# Patient Record
Sex: Female | Born: 1940 | Race: White | Hispanic: No | Smoking: Never smoker
Health system: Southern US, Community
[De-identification: ages and names within clinical notes are randomized; demographics above are authoritative.]

## PROBLEM LIST (undated history)

## (undated) DIAGNOSIS — R1310 Dysphagia, unspecified: Secondary | ICD-10-CM

## (undated) DIAGNOSIS — J329 Chronic sinusitis, unspecified: Secondary | ICD-10-CM

## (undated) DIAGNOSIS — K219 Gastro-esophageal reflux disease without esophagitis: Secondary | ICD-10-CM

## (undated) DIAGNOSIS — J45909 Unspecified asthma, uncomplicated: Secondary | ICD-10-CM

## (undated) DIAGNOSIS — M797 Fibromyalgia: Secondary | ICD-10-CM

## (undated) DIAGNOSIS — I4891 Unspecified atrial fibrillation: Secondary | ICD-10-CM

## (undated) HISTORY — PX: BACK SURGERY: SHX140

---

## 2009-07-16 ENCOUNTER — Ambulatory Visit: Payer: Self-pay | Admitting: Family Medicine

## 2009-07-16 DIAGNOSIS — S46909A Unspecified injury of unspecified muscle, fascia and tendon at shoulder and upper arm level, unspecified arm, initial encounter: Secondary | ICD-10-CM | POA: Insufficient documentation

## 2009-07-16 DIAGNOSIS — J45909 Unspecified asthma, uncomplicated: Secondary | ICD-10-CM | POA: Insufficient documentation

## 2009-07-16 DIAGNOSIS — S139XXA Sprain of joints and ligaments of unspecified parts of neck, initial encounter: Secondary | ICD-10-CM | POA: Insufficient documentation

## 2009-07-16 DIAGNOSIS — S4980XA Other specified injuries of shoulder and upper arm, unspecified arm, initial encounter: Secondary | ICD-10-CM | POA: Insufficient documentation

## 2009-07-16 DIAGNOSIS — S99919A Unspecified injury of unspecified ankle, initial encounter: Secondary | ICD-10-CM

## 2009-07-16 DIAGNOSIS — S8990XA Unspecified injury of unspecified lower leg, initial encounter: Secondary | ICD-10-CM | POA: Insufficient documentation

## 2009-07-16 DIAGNOSIS — M1990 Unspecified osteoarthritis, unspecified site: Secondary | ICD-10-CM | POA: Insufficient documentation

## 2009-07-16 DIAGNOSIS — S2341XA Sprain of ribs, initial encounter: Secondary | ICD-10-CM | POA: Insufficient documentation

## 2009-07-16 DIAGNOSIS — S99929A Unspecified injury of unspecified foot, initial encounter: Secondary | ICD-10-CM

## 2010-07-29 NOTE — Letter (Signed)
Summary: Out of Work  [facility]  [address]   [place], [place] [address]   Phone: [phone]  Fax: [phone]    July 16, 2009   Employee:  EDY MCBANE    To Whom It May Concern:   For Medical reasons, please excuse the above named employee from work today.  If you need additional information, please feel free to contact our office.         Sincerely,    Donna Christen MD

## 2010-07-29 NOTE — Assessment & Plan Note (Signed)
Summary: INJURY TO LEG & RIBS/KH   Vital Signs:  Patient Profile:   70 Years Old Female CC:      Fall-kneeand L rib pain Height:     62 inches Weight:      148 pounds O2 Sat:      96 % O2 treatment:    Room Air Temp:     96.3 degrees F oral Pulse rate:   75 / minute Resp:     12 per minute BP sitting:   159 / 81  (right arm)  Pt. in pain?   yes    Location:   knee    Intensity:   8    Type:       dull  Vitals Entered By: Lita Mains                   Updated Prior Medication List: PREDNISONE 10 MG TABS (PREDNISONE) once daily * TEMAZEPAM 10 MG CAPS (TEMAZEPAM) qhs GLUCOSAMINE FORTE  CAPS (NUTRITIONAL SUPPLEMENTS) BID  Current Allergies: ! ASPIR-LOW (ASPIRIN) ! CODEINE PHOSPHATE (CODEINE PHOSPHATE)History of Present Illness History from: patient Chief Complaint: Fall-kneeand L rib pain History of Present Illness: Fell on ice this AM (07/16/2009). Fell on left side. Patient c/o left knee pain, left rib pain (burning) with some SOB, and sore neck. Left knee is slightly edematous with two abrasions. Left side is unremarkable.  Subjective:  Patient complains of history as above.  No loss of consciousness.  No neuro symptoms.  No shortness of breath   REVIEW OF SYSTEMS Constitutional Symptoms      Denies fever, chills, night sweats, weight loss, weight gain, and fatigue.  Eyes       Denies change in vision, eye pain, eye discharge, glasses, contact lenses, and eye surgery. Ear/Nose/Throat/Mouth       Denies hearing loss/aids, change in hearing, ear pain, ear discharge, dizziness, frequent runny nose, frequent nose bleeds, sinus problems, sore throat, hoarseness, and tooth pain or bleeding.  Respiratory       Denies dry cough, productive cough, wheezing, shortness of breath, asthma, bronchitis, and emphysema/COPD.  Cardiovascular       Denies murmurs, chest pain, and tires easily with exhertion.    Gastrointestinal       Denies stomach pain, nausea/vomiting,  diarrhea, constipation, blood in bowel movements, and indigestion. Genitourniary       Denies painful urination, kidney stones, and loss of urinary control. Neurological       Denies paralysis, seizures, and fainting/blackouts. Musculoskeletal       Complains of muscle pain, joint pain, and swelling.      Denies joint stiffness, decreased range of motion, redness, muscle weakness, and gout.      Comments: left knee, left side, and sore neck Skin       Denies bruising, unusual mles/lumps or sores, and hair/skin or nail changes.      Comments: abrasions to left knee Psych       Denies mood changes, temper/anger issues, anxiety/stress, speech problems, depression, and sleep problems.  Past History:  Past Medical History: fibromyalgia Asthma Osteoarthritis  Past Surgical History: nasal polyps removed left wrist repair post fall-2006 cervial spinal surgery from same fall in 2006 Hysterectomy  Family History: Family History Breast cancer 1st degree relative <50-sister Family History of Cardiovascular disorder-stenosis-mother  Social History: Never Smoked Alcohol use-no Drug use-no Smoking Status:  never Drug Use:  no   Objective:  No acute distress.  Patient is alert and oriented  Eyes:  Pupils are equal, round, and reactive to light and accomdation.  Extraocular movement is intact.  Conjunctivae are not inflamed.  Mouth:  No injuries Neck:  Mild bilateral muscle tenderness Lungs:  Clear to auscultation.  Breath sounds are equal.  Chest:  Tenderness over left posterior/lateral ribs Heart:  Regular rate and rhythm without murmurs, rubs, or gallops.  Abdomen:  Nontender without masses or hepatosplenomegaly.  Bowel sounds are present.  No CVA or flank tenderness.  Left shoulder:  Mild diffuse tenderness without swelling or deformity.  Good internal/external rotation.  Has difficulty completely abducting above horizontal.  Left knee:  No effusion,  erythema, or warmth.  Knee  stable, negative drawer test.  McMurray test negative.  Full range of motion.   Tenderness over patella with minimal abrasion there. X-ray results: Chest with rib detail negative Left shoulder negative Left knee negative C-spine:  post-surgical changes, nothing acute Assessment New Problems: KNEE INJURY, LEFT (ICD-959.7) SPRAIN AND STRAIN OF RIBS (ICD-848.3) SHOULDER INJURY, LEFT (ICD-959.2) CERVICAL STRAIN, ACUTE (ICD-847.0) FAMILY HISTORY BREAST CANCER 1ST DEGREE RELATIVE <50 (ICD-V16.3) OSTEOARTHRITIS (ICD-715.90) ASTHMA (ICD-493.90)   Plan New Orders: T-Chest x-ray, 3 views [71022] T-DG Shoulder*L* [73030] T-DG Knee Complete 4 Views*L* [id number] T-Cervical Spine Comp 4 Views [72050TC] New Patient Level III [[address]] Rib Belt [L0210] Planning Comments:   Wear C-collar (which she already has) for 3 to 5 days.  Ice packs.  Tylenol for pain as needed Rib belt dispensed.  Bandage and Bacitracin applied to small abrasion left knee.  Ace wrap applied. Begin shoulder and knee exercises.  ([facility] information and instruction patient handout given)  Follow-up with PCP if not improved 10 to 14 days.   The patient and/or caregiver has been counseled thoroughly with regard to medications prescribed including dosage, schedule, interactions, rationale for use, and possible side effects and they verbalize understanding.  Diagnoses and expected course of recovery discussed and will return if not improved as expected or if the condition worsens. Patient and/or caregiver verbalized understanding.

## 2018-06-17 ENCOUNTER — Other Ambulatory Visit: Payer: Self-pay

## 2018-06-17 ENCOUNTER — Emergency Department
Admission: EM | Admit: 2018-06-17 | Discharge: 2018-06-17 | Disposition: A | Discharge status: Home / Self Care | Payer: Medicare Other | Source: Home / Self Care

## 2018-06-17 ENCOUNTER — Emergency Department (INDEPENDENT_AMBULATORY_CARE_PROVIDER_SITE_OTHER): Payer: Medicare Other

## 2018-06-17 ENCOUNTER — Encounter: Payer: Self-pay | Admitting: Emergency Medicine

## 2018-06-17 DIAGNOSIS — K5903 Drug induced constipation: Secondary | ICD-10-CM | POA: Diagnosis not present

## 2018-06-17 DIAGNOSIS — R05 Cough: Secondary | ICD-10-CM

## 2018-06-17 HISTORY — DX: Unspecified atrial fibrillation: I48.91

## 2018-06-17 HISTORY — DX: Dysphagia, unspecified: R13.10

## 2018-06-17 HISTORY — DX: Chronic sinusitis, unspecified: J32.9

## 2018-06-17 HISTORY — DX: Unspecified asthma, uncomplicated: J45.909

## 2018-06-17 HISTORY — DX: Gastro-esophageal reflux disease without esophagitis: K21.9

## 2018-06-17 HISTORY — DX: Fibromyalgia: M79.7

## 2018-06-17 LAB — POCT INFLUENZA A/B
Influenza A, POC: NEGATIVE
Influenza B, POC: NEGATIVE

## 2018-06-17 MED ORDER — LINACLOTIDE 145 MCG PO CAPS: 145.0000 ug | ORAL_CAPSULE | Freq: Every day | ORAL | 1 refills | Status: DC

## 2018-06-17 NOTE — ED Triage Notes (Signed)
Nausea, body aches,chills, pain all over, constipation x 4 days

## 2018-06-17 NOTE — Discharge Instructions (Addendum)
Increase water intake.  Try eating cheerios.  Increase fiber

## 2018-06-17 NOTE — ED Provider Notes (Signed)
[facility]    CSN: [id number] Arrival date & time: 06/17/18  1416     History   Chief Complaint Chief Complaint  Patient presents with  . Nausea    HPI Tanya Rodriguez is a 77 y.o. female.   The history is provided by the patient and a relative. No language interpreter was used.  Weakness  This is a recurrent problem. The problem has not changed since onset.There was no focality noted. Pertinent negatives include no shortness of breath. There were no medications administered prior to arrival. Associated medical issues do not include trauma.  Pt complains of feeling weak and constipated.  Family member reports Home health nurse wanted pt to come here for a flu test and a chest xray.  Pt reports she has been constipated.  No relief from exlax.  Pt is aon narcotics for pain. Pt reports poor appetite   Past Medical History:  Diagnosis Date  . A-fib (HCC)   . Asthma   . Chronic sinusitis   . Dysphagia   . Fibromyalgia   . GERD (gastroesophageal reflux disease)     Patient Active Problem List   Diagnosis Date Noted  . ASTHMA 07/16/2009  . OSTEOARTHRITIS 07/16/2009  . CERVICAL STRAIN, ACUTE 07/16/2009  . SPRAIN AND STRAIN OF RIBS 07/16/2009  . SHOULDER INJURY, LEFT 07/16/2009  . KNEE INJURY, LEFT 07/16/2009    History reviewed. No pertinent surgical history.  OB History   No obstetric history on file.      Home Medications    Prior to Admission medications   Medication Sig Start Date End Date Taking? Authorizing Provider  Glucosamine-Chondroit-Vit C-Mn (GLUCOSAMINE CHONDR 500 COMPLEX PO) Take by mouth.   Yes [provider]  Lidocaine-Menthol 4-1 % GEL Apply topically.   Yes [provider]  Omega-3 Fatty Acids (FISH OIL) 1000 MG CAPS Take by mouth.   Yes [provider]  pantoprazole (PROTONIX) 40 MG tablet Take 40 mg by mouth daily.   Yes [provider]  polyethylene glycol (MIRALAX / GLYCOLAX) packet Take 17 g by  mouth daily.   Yes [provider]  predniSONE (DELTASONE) 10 MG tablet Take 10 mg by mouth daily with breakfast.   Yes [provider]  senna-docusate (SENOKOT-S) 8.6-50 MG tablet Take 1 tablet by mouth daily.   Yes [provider]  vitamin E 400 UNIT capsule Take 400 Units by mouth daily.   Yes [provider]  linaclotide Karlene Einstein) 145 MCG CAPS capsule Take 1 capsule (145 mcg total) by mouth daily before breakfast. 06/17/18   Elson Areas, PA-C    Family History No family history on file.  Social History Social History   Tobacco Use  . Smoking status: Never Smoker  . Smokeless tobacco: Never Used  Substance Use Topics  . Alcohol use: Not Currently  . Drug use: Never     Allergies   Aspirin; Codeine phosphate; Lyrica [pregabalin]; Oxycodone; and Pollen extract   Review of Systems Review of Systems  Respiratory: Negative for shortness of breath.   Gastrointestinal: Positive for abdominal pain.  Neurological: Positive for weakness.  All other systems reviewed and are negative.    Physical Exam Triage Vital Signs ED Triage Vitals  Enc Vitals Group     BP 06/17/18 1533 (!) 161/93     Pulse Rate 06/17/18 1533 77     Resp --      Temp 06/17/18 1533 98.1 F (36.7 C)     Temp  Source 06/17/18 1533 Oral     SpO2 06/17/18 1533 99 %     Weight 06/17/18 1534 146 lb (66.2 kg)     Height 06/17/18 1534 5\' 2"  (1.575 m)     Head Circumference --      Peak Flow --      Pain Score 06/17/18 1533 5     Pain Loc --      Pain Edu? --      Excl. in GC? --    No data found.  Updated Vital Signs BP (!) 161/93 (BP Location: Left Arm)   Pulse 77   Temp 98.1 F (36.7 C) (Oral)   Ht 5\' 2"  (1.575 m)   Wt 146 lb (66.2 kg)   SpO2 99%   BMI 26.70 kg/m   Visual Acuity Right Eye Distance:   Left Eye Distance:   Bilateral Distance:    Right Eye Near:   Left Eye Near:    Bilateral Near:     Physical Exam Vitals signs reviewed.  HENT:      Head: Normocephalic.     Right Ear: Tympanic membrane normal.     Left Ear: Tympanic membrane normal.     Nose: Nose normal.  Eyes:     Pupils: Pupils are equal, round, and reactive to light.  Neck:     Musculoskeletal: Normal range of motion.  Cardiovascular:     Rate and Rhythm: Normal rate.     Pulses: Normal pulses.  Pulmonary:     Effort: Pulmonary effort is normal.  Abdominal:     General: Abdomen is flat.  Musculoskeletal: Normal range of motion.  Skin:    General: Skin is warm.  Neurological:     General: No focal deficit present.     Mental Status: She is alert.  Psychiatric:        Mood and Affect: Mood normal.      UC Treatments / Results  Labs (all labs ordered are listed, but only abnormal results are displayed) Labs Reviewed  POCT INFLUENZA A/B    EKG None  Radiology Dg Abd Acute W/chest  Result Date: 06/17/2018 CLINICAL DATA:  Pain and cough EXAM: DG ABDOMEN ACUTE W/ 1V CHEST COMPARISON:  07/16/2009 FINDINGS: Postsurgical changes in the cervical spine. No acute consolidation or effusion. Normal cardiomediastinal silhouette with aortic atherosclerosis. Supine and upright views of the abdomen demonstrate no free air beneath the diaphragm. Nonobstructed bowel-gas pattern with mild stool. Multiple phleboliths in the pelvis. Small lucent lesion/possible cyst in the right inferior pubic ramus. Treated compression fracture L1. IMPRESSION: 1. No radiographic evidence for acute cardiopulmonary abnormality 2. Nonobstructed bowel-gas pattern Electronically Signed   By: Jasmine PangKim  Fujinaga M.D.   On: 06/17/2018 16:40    Procedures Procedures (including critical care time)  Medications Ordered in UC Medications - No data to display  Initial Impression / Assessment and Plan / UC Course  I have reviewed the triage vital signs and the nursing notes.  Pertinent labs & imaging results that were available during my care of the patient were reviewed by me and considered in  my medical decision making (see chart for details).     Influenza negative,  Chest xray no acute.  Pt given rx for linzess.  Pt advised try fleets x1, ducolax supp x1 and one dose of MOM.  Pt encouraged to drink water, encourage to drink ensure for protein and fiber (cherrios) fruit and vegetables.  Final Clinical Impressions(s) / UC Diagnoses   Final  diagnoses:  Drug-induced constipation     Discharge Instructions     Increase water intake.  Try eating cheerios.  Increase fiber   ED Prescriptions    Medication Sig Dispense Auth. Provider   linaclotide (LINZESS) 145 MCG CAPS capsule Take 1 capsule (145 mcg total) by mouth daily before breakfast. 30 capsule Elson AreasSofia, Leslie K, [place]-C     Controlled Substance Prescriptions [place] Controlled Substance Registry consulted? Not Applicable   Elson AreasSofia, Leslie K, [place]-C 06/17/18 1729

## 2018-07-21 ENCOUNTER — Emergency Department (INDEPENDENT_AMBULATORY_CARE_PROVIDER_SITE_OTHER): Payer: Medicare Other

## 2018-07-21 ENCOUNTER — Encounter: Payer: Self-pay | Admitting: *Deleted

## 2018-07-21 ENCOUNTER — Emergency Department (INDEPENDENT_AMBULATORY_CARE_PROVIDER_SITE_OTHER)
Admission: EM | Admit: 2018-07-21 | Discharge: 2018-07-21 | Disposition: A | Discharge status: Home / Self Care | Payer: Medicare Other | Source: Home / Self Care | Attending: Family Medicine | Admitting: Family Medicine

## 2018-07-21 DIAGNOSIS — M8000XA Age-related osteoporosis with current pathological fracture, unspecified site, initial encounter for fracture: Secondary | ICD-10-CM

## 2018-07-21 DIAGNOSIS — I700 Atherosclerosis of aorta: Secondary | ICD-10-CM

## 2018-07-21 DIAGNOSIS — X58XXXA Exposure to other specified factors, initial encounter: Secondary | ICD-10-CM

## 2018-07-21 DIAGNOSIS — S2231XA Fracture of one rib, right side, initial encounter for closed fracture: Secondary | ICD-10-CM | POA: Diagnosis not present

## 2018-07-21 DIAGNOSIS — S22000A Wedge compression fracture of unspecified thoracic vertebra, initial encounter for closed fracture: Secondary | ICD-10-CM | POA: Diagnosis not present

## 2018-07-21 DIAGNOSIS — T1490XA Injury, unspecified, initial encounter: Secondary | ICD-10-CM | POA: Diagnosis not present

## 2018-07-21 DIAGNOSIS — J90 Pleural effusion, not elsewhere classified: Secondary | ICD-10-CM | POA: Diagnosis not present

## 2018-07-21 DIAGNOSIS — S22079A Unspecified fracture of T9-T10 vertebra, initial encounter for closed fracture: Secondary | ICD-10-CM

## 2018-07-21 MED ORDER — MORPHINE SULFATE 15 MG PO TABS: 15.0000 mg | ORAL_TABLET | Freq: Four times a day (QID) | ORAL | 0 refills | Status: DC | PRN

## 2018-07-21 MED ORDER — ONDANSETRON 4 MG PO TBDP
4.0000 mg | ORAL_TABLET | Freq: Once | ORAL | Status: AC
  Administered 2018-07-21: 4 mg via ORAL

## 2018-07-21 MED ORDER — CALCITONIN (SALMON) 200 UNIT/ACT NA SOLN: NASAL | 0 refills | Status: DC

## 2018-07-21 NOTE — ED Triage Notes (Signed)
While on a "twisting" type exercise machine at the gym, pt had sudden onset lower thoracic/ upper lumbar back pain 2 weeks ago. Pain has gradually increased and is much worse today. Has a recent hx of lumbar disc injury with surgery in November.

## 2018-07-21 NOTE — Discharge Instructions (Addendum)
Stop taking oxycodone while taking morphine.  May take Tylenol 500mg  (4 times daily) with each dose of morphine.  After finishing morphine, may resume oxycodone.

## 2018-07-21 NOTE — ED Provider Notes (Signed)
[facility]    CSN: [id number] Arrival date & time: 07/21/18  [id number]     History   Chief Complaint Chief Complaint  Patient presents with  . Back Pain    HPI Lucy ChrisBlenda Dsouza is a 78 y.o. female.   Patient underwent kyphoplasty May 17, 2018 for osteoporotic compression fracture of L1.  She did reasonably well post-op with a physical therapy program.  About 2 weeks ago while doing a back "twisting" exercise on an exercise machine at her gym, she suddenly developed pain in her right lateral chest and increased pain in her thoracic spine area.  She has found no comfortable position, and her pain is not responding to oxycodone 5mg  with Tylenol.  The history is provided by the patient and a relative.  Back Pain  Location:  Thoracic spine and lumbar spine (right lateral chest) Radiates to:  Does not radiate Pain severity:  Severe Pain is:  Same all the time Onset quality:  Sudden Duration:  2 weeks Timing:  Constant Progression:  Unchanged Chronicity:  Recurrent Context: twisting   Relieved by:  Nothing Worsened by:  Movement, deep breathing, standing and twisting Ineffective treatments:  Narcotics Associated symptoms: chest pain   Associated symptoms: no abdominal pain, no bladder incontinence, no bowel incontinence, no dysuria, no fever, no leg pain, no numbness, no paresthesias, no pelvic pain, no perianal numbness, no tingling and no weakness   Risk factors: hx of osteoporosis, lack of exercise and menopause     Past Medical History:  Diagnosis Date  . A-fib (HCC)   . Asthma   . Chronic sinusitis   . Dysphagia   . Fibromyalgia   . GERD (gastroesophageal reflux disease)     Patient Active Problem List   Diagnosis Date Noted  . ASTHMA 07/16/2009  . OSTEOARTHRITIS 07/16/2009  . CERVICAL STRAIN, ACUTE 07/16/2009  . SPRAIN AND STRAIN OF RIBS 07/16/2009  . SHOULDER INJURY, LEFT 07/16/2009  . KNEE INJURY, LEFT 07/16/2009    Past Surgical History:  Procedure  Laterality Date  . BACK SURGERY         Home Medications    Prior to Admission medications   Medication Sig Start Date End Date Taking? Authorizing Provider  Glucosamine-Chondroit-Vit C-Mn (GLUCOSAMINE CHONDR 500 COMPLEX PO) Take by mouth.   Yes [provider]  linaclotide Karlene Einstein(LINZESS) 145 MCG CAPS capsule Take 1 capsule (145 mcg total) by mouth daily before breakfast. 06/17/18  Yes Cheron SchaumannSofia, Leslie K, PA-C  Omega-3 Fatty Acids (FISH OIL) 1000 MG CAPS Take by mouth.   Yes [provider]  pantoprazole (PROTONIX) 40 MG tablet Take 40 mg by mouth daily.   Yes [provider]  polyethylene glycol (MIRALAX / GLYCOLAX) packet Take 17 g by mouth daily.   Yes [provider]  predniSONE (DELTASONE) 10 MG tablet Take 10 mg by mouth daily with breakfast.   Yes [provider]  vitamin E 400 UNIT capsule Take 400 Units by mouth daily.   Yes [provider]  calcitonin, salmon, (MIACALCIN) 200 UNIT/ACT nasal spray Place one spray in one nostril daily.  Alternate side each day.  Do not use for more than two weeks 07/21/18   Lattie HawBeese, Shikira Folino A, MD  Lidocaine-Menthol 4-1 % GEL Apply topically.    [provider]  morphine (MSIR) 15 MG tablet Take 1 tablet (15 mg total) by mouth every 6 (six) hours as needed for severe pain. 07/21/18   Lattie HawBeese, Princella Jaskiewicz A, MD  senna-docusate (SENOKOT-S) 8.6-50  MG tablet Take 1 tablet by mouth daily.    [provider]    Family History Stroke, arthritis, cancer  Social History Social History   Tobacco Use  . Smoking status: Never Smoker  . Smokeless tobacco: Never Used  Substance Use Topics  . Alcohol use: Not Currently  . Drug use: Never     Allergies   Aspirin; Codeine phosphate; Lyrica [pregabalin]; Oxycodone; and Pollen extract   Review of Systems Review of Systems  Constitutional: Negative for fever.  Cardiovascular: Positive for chest pain.  Gastrointestinal: Negative for abdominal pain  and bowel incontinence.  Genitourinary: Negative for bladder incontinence, dysuria and pelvic pain.  Musculoskeletal: Positive for back pain.  Neurological: Negative for tingling, weakness, numbness and paresthesias.  All other systems reviewed and are negative.    Physical Exam Triage Vital Signs ED Triage Vitals  Enc Vitals Group     BP 07/21/18 0954 (!) 145/76     Pulse Rate 07/21/18 0954 88     Resp 07/21/18 0954 16     Temp 07/21/18 0954 97.7 F (36.5 C)     Temp Source 07/21/18 0954 Oral     SpO2 07/21/18 0954 99 %     Weight 07/21/18 0955 150 lb (68 kg)     Height 07/21/18 0955 5\' 2"  (1.575 m)     Head Circumference --      Peak Flow --      Pain Score 07/21/18 0955 10     Pain Loc --      Pain Edu? --      Excl. in GC? --    No data found.  Updated Vital Signs BP (!) 145/76 (BP Location: Right Arm)   Pulse 88   Temp 97.7 F (36.5 C) (Oral)   Resp 16   Ht 5\' 2"  (1.575 m)   Wt 68 kg   SpO2 99%   BMI 27.44 kg/m   Visual Acuity Right Eye Distance:   Left Eye Distance:   Bilateral Distance:    Right Eye Near:   Left Eye Near:    Bilateral Near:     Physical Exam Constitutional:      General: She is in acute distress.     Appearance: Normal appearance. She is not ill-appearing, toxic-appearing or diaphoretic.  HENT:     Head: Normocephalic.     Right Ear: External ear normal.     Left Ear: External ear normal.     Nose: Nose normal.     Mouth/Throat:     Mouth: Mucous membranes are moist.  Eyes:     Pupils: Pupils are equal, round, and reactive to light.  Neck:     Musculoskeletal: Normal range of motion.  Cardiovascular:     Rate and Rhythm: Normal rate.     Heart sounds: Normal heart sounds.  Pulmonary:     Breath sounds: Normal breath sounds.       Comments: Diffuse tenderness to palpation right posterior/lateral chest as noted on diagram.  Abdominal:     Tenderness: There is no abdominal tenderness.  Musculoskeletal:       Back:      Right lower leg: No edema.     Left lower leg: No edema.     Comments: Back:  Decreased range of motion. Tenderness in the midline and right paraspinous muscles as noted on diagram.  Strength and sensation in the lower extremities is normal.    Skin:    General: Skin is  warm and dry.     Findings: No rash.  Neurological:     General: No focal deficit present.     Mental Status: She is alert.      UC Treatments / Results  Labs (all labs ordered are listed, but only abnormal results are displayed) Labs Reviewed - No data to display  EKG None  Radiology Dg Ribs Unilateral W/chest Right  Result Date: 07/21/2018 CLINICAL DATA:  Chest pain.  Prior injury. EXAM: RIGHT RIBS AND CHEST - 3+ VIEW COMPARISON:  07/21/2018. FINDINGS: Mediastinum and hilar structures normal. Mild left base subsegmental atelectasis. Small bilateral pleural effusions. No pneumothorax. Slightly displaced right posterolateral seventh rib fracture is noted. Midthoracic vertebral body compression fracture appears stable. Degenerative change thoracic spine. Prior cervical spine fusion appears stable. Prior upper lumbar spine vertebroplasty appears stable. IMPRESSION: 1. Slightly displaced right posterolateral sixth rib fracture. No pneumothorax. 2.  Small bilateral pleural effusions. Electronically Signed   By: Maisie Fus  Register   On: 07/21/2018 11:32   Dg Thoracic Spine 2 View  Result Date: 07/21/2018 CLINICAL DATA:  Right-sided thoracic and chest pain for 2 weeks after exercising. EXAM: THORACIC SPINE 2 VIEWS COMPARISON:  Chest/abdominal radiographs 06/17/2018 FINDINGS: There is mild exaggeration of the thoracic kyphosis without listhesis. Mild S-shaped thoracolumbar scoliosis is again seen. There is a T9 compression fracture with mild-to-moderate vertebral body height loss with an appearance suggesting a recent fracture. Comparison with the prior chest/abdominal study is limited due to the lack of a lateral radiograph. Prior  anterior fusion is noted in the cervical spine. There has been prior L1 vertebral augmentation. Detailed rib assessment is reported separately. Aortic atherosclerosis is noted. IMPRESSION: Mild-to-moderate T9 compression fracture, potentially acute. Electronically Signed   By: Sebastian Ache M.D.   On: 07/21/2018 11:34   Dg Lumbar Spine 2-3 Views  Result Date: 07/21/2018 CLINICAL DATA:  Low back pain for 2 weeks since an injury while exercising. Initial encounter. EXAM: LUMBAR SPINE - 2-3 VIEW COMPARISON:  None. FINDINGS: The patient has remote L1 compression fracture where she is status post vertebral augmentation. No other fracture is identified. There is exaggeration of the normal lumbar lordosis and mild convex left scoliosis with the apex at L3-4. Intervertebral disc space height is maintained. Lower lumbar facet arthropathy is noted. There is some degenerative disease about the hips. Aortic atherosclerosis is identified. IMPRESSION: No acute abnormality. Status post vertebral augmentation for an L1 compression fracture. Lower lumbar facet arthropathy. Atherosclerosis. Electronically Signed   By: Drusilla Kanner M.D.   On: 07/21/2018 11:30    Procedures Procedures (including critical care time)  Medications Ordered in UC Medications  ondansetron (ZOFRAN-ODT) disintegrating tablet 4 mg (4 mg Oral Given 07/21/18 1102)    Initial Impression / Assessment and Plan / UC Course  I have reviewed the triage vital signs and the nursing notes.  Pertinent labs & imaging results that were available during my care of the patient were reviewed by me and considered in my medical decision making (see chart for details).    Note diffuse bone demineralization on x-rays.  Patient's present pain not controlled by oxycodone and Tylenol.  Rx written for morphine sulfate 15mg  (#20, no refill).  Patient notes that she has had only mild itching in the past with morphine and would like to try it again. Controlled  Substance Prescriptions I have consulted the [place] Controlled Substances Registry for this patient, and feel the risk/benefit ratio today is favorable for proceeding with this prescription for a  controlled substance.   For improved pain control, begin calcitonin nasal spray for about 2 weeks.  Will refer to [facility] [facility] and [facility] [place] [facility] with [facility] as scheduled as soon as possible.   Final Clinical Impressions(s) / UC Diagnoses   Final diagnoses:  Closed fracture of one rib of right side, initial encounter  Closed compression fracture of thoracic vertebra, initial encounter (HCC)  Age-related osteoporosis with current pathological fracture, initial encounter     Discharge Instructions     Stop taking oxycodone while taking morphine.  May take Tylenol 500mg  (4 times daily) with each dose of morphine.  After finishing morphine, may resume oxycodone.    ED Prescriptions    Medication Sig Dispense Auth. Provider   calcitonin, salmon, (MIACALCIN) 200 UNIT/ACT nasal spray Place one spray in one nostril daily.  Alternate side each day.  Do not use for more than two weeks 2.7 mL Lattie HawBeese, Daley Mooradian A, MD   morphine (MSIR) 15 MG tablet Take 1 tablet (15 mg total) by mouth every 6 (six) hours as needed for severe pain. 20 tablet Lattie HawBeese, Ryman Rathgeber A, MD         Lattie HawBeese, Jennye Runquist A, MD 07/21/18 [phone]

## 2018-07-22 ENCOUNTER — Telehealth: Payer: Self-pay

## 2018-07-22 NOTE — Telephone Encounter (Signed)
No opening for Dr T nor Dr Denyse Amass until Tuesday. Pt advised, declines to see any other provider for back pain until she can be seen here. Advised pt to keep her Tuesday appt and recommended she call on Monday to see if there were any cancellations. Pt agreeable

## 2018-07-22 NOTE — Telephone Encounter (Signed)
Do Dr. Denyse Amass or I have anything sooner?  If not I would recommend she contact her PCP for acute pain management until we can see her in a couple days.

## 2018-07-22 NOTE — Telephone Encounter (Signed)
Tanya Rodriguez states she would like to be seen sooner than Tuesday. She has terrible back pain.

## 2018-07-26 ENCOUNTER — Ambulatory Visit (INDEPENDENT_AMBULATORY_CARE_PROVIDER_SITE_OTHER): Payer: Medicare Other

## 2018-07-26 ENCOUNTER — Ambulatory Visit (INDEPENDENT_AMBULATORY_CARE_PROVIDER_SITE_OTHER): Payer: Medicare Other | Admitting: Sports Medicine

## 2018-07-26 ENCOUNTER — Encounter: Payer: Self-pay | Admitting: Sports Medicine

## 2018-07-26 DIAGNOSIS — M797 Fibromyalgia: Secondary | ICD-10-CM | POA: Diagnosis not present

## 2018-07-26 DIAGNOSIS — S2231XD Fracture of one rib, right side, subsequent encounter for fracture with routine healing: Secondary | ICD-10-CM | POA: Diagnosis not present

## 2018-07-26 DIAGNOSIS — X58XXXD Exposure to other specified factors, subsequent encounter: Secondary | ICD-10-CM

## 2018-07-26 DIAGNOSIS — M8080XA Other osteoporosis with current pathological fracture, unspecified site, initial encounter for fracture: Secondary | ICD-10-CM | POA: Diagnosis not present

## 2018-07-26 DIAGNOSIS — S2231XA Fracture of one rib, right side, initial encounter for closed fracture: Secondary | ICD-10-CM | POA: Diagnosis not present

## 2018-07-26 DIAGNOSIS — M4850XA Collapsed vertebra, not elsewhere classified, site unspecified, initial encounter for fracture: Secondary | ICD-10-CM | POA: Insufficient documentation

## 2018-07-26 DIAGNOSIS — S22070A Wedge compression fracture of T9-T10 vertebra, initial encounter for closed fracture: Secondary | ICD-10-CM

## 2018-07-26 DIAGNOSIS — M545 Low back pain: Secondary | ICD-10-CM | POA: Diagnosis not present

## 2018-07-26 DIAGNOSIS — M810 Age-related osteoporosis without current pathological fracture: Secondary | ICD-10-CM | POA: Insufficient documentation

## 2018-07-26 MED ORDER — DULOXETINE HCL 30 MG PO CPEP: 30.0000 mg | ORAL_CAPSULE | Freq: Every day | ORAL | 3 refills | Status: DC

## 2018-07-26 MED ORDER — ONDANSETRON 8 MG PO TBDP: 8.0000 mg | ORAL_TABLET | Freq: Three times a day (TID) | ORAL | 3 refills | Status: DC | PRN

## 2018-07-26 MED ORDER — POLYETHYLENE GLYCOL 3350 17 G PO PACK: 17.0000 g | PACK | Freq: Two times a day (BID) | ORAL | 11 refills | Status: DC

## 2018-07-26 MED ORDER — HYDROMORPHONE HCL 4 MG PO TABS: 4.0000 mg | ORAL_TABLET | Freq: Four times a day (QID) | ORAL | 0 refills | Status: DC | PRN

## 2018-07-26 MED ORDER — SENNOSIDES-DOCUSATE SODIUM 8.6-50 MG PO TABS: 2.0000 | ORAL_TABLET | Freq: Two times a day (BID) | ORAL | 0 refills | Status: DC

## 2018-07-26 NOTE — Assessment & Plan Note (Signed)
Age-related. Now has a vertebral compression fracture. I have advised her to discuss treatment of her osteoporosis with her primary care provider. There are multiple options, I think Fosamax would be a good place to start. She should also be on a calcium and vitamin D supplement twice a day. She is having some nausea, I think likely secondary to her narcotics so I will refill her Zofran.

## 2018-07-26 NOTE — Assessment & Plan Note (Signed)
We will never be able to fully control her pain until we get her fibromyalgia under control. Discontinue Zoloft, adding Cymbalta. Declines gabapentin and Lyrica.

## 2018-07-26 NOTE — Assessment & Plan Note (Signed)
Repeat x-rays. Thorax was strapped with a compressive dressing.

## 2018-07-26 NOTE — Progress Notes (Addendum)
Subjective:    CC: Multiple fractures  HPI:  This is a pleasant 78 year old female, she has a history of osteoporosis.  More recently she had a fall, ultimately she was seen in urgent care where x-rays showed 1/6 rib fracture on the right, as well as a T9 vertebral compression fracture with minimal height loss.  Pain is been severe, not controlled with MSIR 15 mg 4 times daily.  Severe, persistent, localized at the 2 above-named sites without radiation.  No progressive weakness in the legs, no constitutional symptoms.  I reviewed the past medical history, family history, social history, surgical history, and allergies today and no changes were needed.  Please see the problem list section below in epic for further details.  Past Medical History: Past Medical History:  Diagnosis Date  . A-fib (HCC)   . Asthma   . Chronic sinusitis   . Dysphagia   . Fibromyalgia   . GERD (gastroesophageal reflux disease)    Past Surgical History: Past Surgical History:  Procedure Laterality Date  . BACK SURGERY     Social History: Social History   Socioeconomic History  . Marital status: Divorced    Spouse name: Not on file  . Number of children: Not on file  . Years of education: Not on file  . Highest education level: Not on file  Occupational History  . Not on file  Social Needs  . Financial resource strain: Not on file  . Food insecurity:    Worry: Not on file    Inability: Not on file  . Transportation needs:    Medical: Not on file    Non-medical: Not on file  Tobacco Use  . Smoking status: Never Smoker  . Smokeless tobacco: Never Used  Substance and Sexual Activity  . Alcohol use: Not Currently  . Drug use: Never  . Sexual activity: Not on file  Lifestyle  . Physical activity:    Days per week: Not on file    Minutes per session: Not on file  . Stress: Not on file  Relationships  . Social connections:    Talks on phone: Not on file    Gets together: Not on file   Attends religious service: Not on file    Active member of club or organization: Not on file    Attends meetings of clubs or organizations: Not on file    Relationship status: Not on file  Other Topics Concern  . Not on file  Social History Narrative  . Not on file   Family History: No family history on file. Allergies: Allergies  Allergen Reactions  . Aspirin     REACTION: swelling  . Codeine Phosphate     REACTION: nausea, itching  . Lyrica [Pregabalin]   . Oxycodone   . Pollen Extract    Medications: See med rec.  Review of Systems: No headache, visual changes, nausea, vomiting, diarrhea, constipation, dizziness, abdominal pain, skin rash, fevers, chills, night sweats, swollen lymph nodes, weight loss, chest pain, body aches, joint swelling, muscle aches, shortness of breath, mood changes, visual or auditory hallucinations.  Objective:    General: Well Developed, well nourished, and in no acute distress.  Neuro: Alert and oriented x3, extra-ocular muscles intact, sensation grossly intact.  HEENT: Normocephalic, atraumatic, pupils equal round reactive to light, neck supple, no masses, no lymphadenopathy, thyroid nonpalpable.  Skin: Warm and dry, no rashes noted.  Cardiac: Regular rate and rhythm, no murmurs rubs or gallops.  Respiratory: Clear to auscultation  bilaterally. Not using accessory muscles, speaking in full sentences.  Abdominal: Soft, nontender, nondistended, positive bowel sounds, no masses, no organomegaly.  Mid back: Tender to palpation over the midthoracic spinous processes. Chest wall: Tender to palpation over the right rib cage at the mid axillary line.  Chest wall was strapped with a compressive dressing.  Impression and Recommendations:    The patient was counselled, risk factors were discussed, anticipatory guidance given.  Fibromyalgia We will never be able to fully control her pain until we get her fibromyalgia under control. Discontinue Zoloft,  adding Cymbalta. Declines gabapentin and Lyrica.  Right sixth rib fracture Repeat x-rays. Thorax was strapped with a compressive dressing.   Vertebral compression fracture (HCC) History of L1 compression fracture post vertebroplasty. More recently she fell and sustained a T9 compression fracture with mild height loss. Currently on Miacalcin. MS Contin 15 mg every 6 hours is not sufficiently controlling her pain so we will double dose and convert to Dilaudid, she is using 60 mg of morphine total daily, this converts to 9 to 12 mg of Dilaudid daily. Switching to Dilaudid 4 mg tabs for use 3 times daily to 4 times daily. She will discontinue all other narcotics. Adding MiraLAX, Senokot-S to prevent constipation. I would also like to contact our [employer] rep to get an Exos form 631 lumbosacral orthosis S/M, this back brace is to facilitate healing following injury to the spine.  Osteoporosis Age-related. Now has a vertebral compression fracture. I have advised her to discuss treatment of her osteoporosis with her primary care provider. There are multiple options, I think Fosamax would be a good place to start. She should also be on a calcium and vitamin D supplement twice a day. She is having some nausea, I think likely secondary to her narcotics so I will refill her Zofran.  I spent 60 minutes with this patient, greater than 50% was face-to-face time counseling regarding the above diagnoses, specifically anticipatory guidance regarding rib and thoracic vertebral compression fractures. ___________________________________________ Ihor Austin. Benjamin Stain, M.D., ABFM., CAQSM. Primary Care and Sports Medicine [facility]  Adjunct Professor of Family Medicine  [employer]

## 2018-07-26 NOTE — Assessment & Plan Note (Addendum)
History of L1 compression fracture post vertebroplasty. More recently she fell and sustained a T9 compression fracture with mild height loss. Currently on Miacalcin. MS Contin 15 mg every 6 hours is not sufficiently controlling her pain so we will double dose and convert to Dilaudid, she is using 60 mg of morphine total daily, this converts to 9 to 12 mg of Dilaudid daily. Switching to Dilaudid 4 mg tabs for use 3 times daily to 4 times daily. She will discontinue all other narcotics. Adding MiraLAX, Senokot-S to prevent constipation. I would also like to contact our [employer] rep to get an Exos form 631 lumbosacral orthosis S/M, this back brace is to facilitate healing following injury to the spine.

## 2018-07-28 ENCOUNTER — Telehealth: Payer: Self-pay

## 2018-07-28 NOTE — Telephone Encounter (Signed)
Mylani called to see if the back brace has arrived.

## 2018-07-28 NOTE — Telephone Encounter (Signed)
Yes, she can sleep with this on.

## 2018-07-28 NOTE — Telephone Encounter (Signed)
I don't see it.  We will call her when it does.  Pain medications working?

## 2018-07-28 NOTE — Telephone Encounter (Signed)
Pt advised. States that she doubled her dose of pain meds by recommendation of her PCP. She is now taking 2 tablets every 6 hours, seems to be working better.   Pt wants to know if she is supposed to sleep in the current band that she has?

## 2018-07-29 MED ORDER — PROMETHAZINE HCL 25 MG PO TABS: 25.0000 mg | ORAL_TABLET | Freq: Four times a day (QID) | ORAL | 3 refills | Status: DC | PRN

## 2018-07-29 NOTE — Telephone Encounter (Signed)
Could be, I am going to add Phenergan.  Make sure she is not taking the pain medicine on an empty stomach.  She also said she was doing better yesterday.

## 2018-07-29 NOTE — Telephone Encounter (Signed)
Pt advised.   Pt reports nausea and vomiting all night, taking Zofran but not helping. Unsure if increase in pain med is causing this. She is still in pain.   Please advise.... thanks

## 2018-08-01 NOTE — Telephone Encounter (Signed)
Pt advised. No further needs at this time 

## 2018-08-08 ENCOUNTER — Ambulatory Visit (INDEPENDENT_AMBULATORY_CARE_PROVIDER_SITE_OTHER): Payer: Medicare Other | Admitting: Sports Medicine

## 2018-08-08 ENCOUNTER — Encounter: Payer: Self-pay | Admitting: Sports Medicine

## 2018-08-08 DIAGNOSIS — M8080XD Other osteoporosis with current pathological fracture, unspecified site, subsequent encounter for fracture with routine healing: Secondary | ICD-10-CM

## 2018-08-08 DIAGNOSIS — S22070A Wedge compression fracture of T9-T10 vertebra, initial encounter for closed fracture: Secondary | ICD-10-CM

## 2018-08-08 DIAGNOSIS — M797 Fibromyalgia: Secondary | ICD-10-CM

## 2018-08-08 DIAGNOSIS — S2231XA Fracture of one rib, right side, initial encounter for closed fracture: Secondary | ICD-10-CM | POA: Diagnosis not present

## 2018-08-08 MED ORDER — HYDROMORPHONE HCL 4 MG PO TABS: 4.0000 mg | ORAL_TABLET | Freq: Two times a day (BID) | ORAL | 0 refills | Status: DC | PRN

## 2018-08-08 NOTE — Assessment & Plan Note (Addendum)
Mildly displaced right seventh rib fracture. Continue thoracic strapping. I am providing medical pain management.

## 2018-08-08 NOTE — Assessment & Plan Note (Signed)
Age-related osteoporosis with new vertebral compression fracture. I did ask her to discuss this with her PCP. We discussed Fosamax as an initial option with calcium and vitamin D supplementation twice a day.

## 2018-08-08 NOTE — Assessment & Plan Note (Signed)
We will never be able to fully control her pain until her fibromyalgia is under control. We did a discontinuation of Zoloft and added Cymbalta at the last visit. Unfortunately she has not taken this, I did inform her that at 1 month we would be discontinuing her narcotics and so she probably needs to get the Cymbalta on board. Declined gabapentin and Lyrica.

## 2018-08-08 NOTE — Progress Notes (Signed)
Subjective:    CC: Recheck multiple fractures  HPI: This is a pleasant 78 year old female, she recently had a fall and sustained a T9 vertebral compression fracture with only minimal height loss and no bony retropulsion into the spinal canal.  She also sustained a right seventh rib fracture, minimally displaced.  At the last visit we put her on Dilaudid, added a thoracic strapping brace.  She actually improved to some degree.  We sent her to her PCP for osteoporosis treatment as well.  Unfortunately she did run out of her Dilaudid.  Symptoms have worsened, localized in the mid back as well as the right sided chest wall without radiation.  No shortness of breath, no hemoptysis.  I did switch her from Zoloft to Cymbalta at the last visit, she has not even started it.  I reviewed the past medical history, family history, social history, surgical history, and allergies today and no changes were needed.  Please see the problem list section below in epic for further details.  Past Medical History: Past Medical History:  Diagnosis Date  . A-fib (HCC)   . Asthma   . Chronic sinusitis   . Dysphagia   . Fibromyalgia   . GERD (gastroesophageal reflux disease)    Past Surgical History: Past Surgical History:  Procedure Laterality Date  . BACK SURGERY     Social History: Social History   Socioeconomic History  . Marital status: Divorced    Spouse name: Not on file  . Number of children: Not on file  . Years of education: Not on file  . Highest education level: Not on file  Occupational History  . Not on file  Social Needs  . Financial resource strain: Not on file  . Food insecurity:    Worry: Not on file    Inability: Not on file  . Transportation needs:    Medical: Not on file    Non-medical: Not on file  Tobacco Use  . Smoking status: Never Smoker  . Smokeless tobacco: Never Used  Substance and Sexual Activity  . Alcohol use: Not Currently  . Drug use: Never  . Sexual  activity: Not on file  Lifestyle  . Physical activity:    Days per week: Not on file    Minutes per session: Not on file  . Stress: Not on file  Relationships  . Social connections:    Talks on phone: Not on file    Gets together: Not on file    Attends religious service: Not on file    Active member of club or organization: Not on file    Attends meetings of clubs or organizations: Not on file    Relationship status: Not on file  Other Topics Concern  . Not on file  Social History Narrative  . Not on file   Family History: No family history on file. Allergies: Allergies  Allergen Reactions  . Aspirin     REACTION: swelling  . Codeine Phosphate     REACTION: nausea, itching  . Lyrica [Pregabalin]   . Oxycodone   . Pollen Extract    Medications: See med rec.  Review of Systems: No fevers, chills, night sweats, weight loss, chest pain, or shortness of breath.   Objective:    General: Well Developed, well nourished, and in no acute distress.  Neuro: Alert and oriented x3, extra-ocular muscles intact, sensation grossly intact.  HEENT: Normocephalic, atraumatic, pupils equal round reactive to light, neck supple, no masses, no lymphadenopathy, thyroid  nonpalpable.  Skin: Warm and dry, no rashes. Cardiac: Regular rate and rhythm, no murmurs rubs or gallops, no lower extremity edema.  Respiratory: Clear to auscultation bilaterally. Not using accessory muscles, speaking in full sentences. Orthopedic: Tender to palpation in the midline of the thoracic spine, minimal.  More severe tenderness over the right rib cage, posterior axillary line around the seventh rib.  No palpable crepitus, no palpable step-offs.  Impression and Recommendations:    Right seventh rib fracture Mildly displaced right seventh rib fracture. Continue thoracic strapping. I am providing medical pain management.  Vertebral compression fracture (HCC) History of an L1 vertebral compression fracture post  vertebroplasty. She now has a new T9 compression fracture with only mild height loss, I do not think she will need a vertebroplasty. Discontinue Miacalcin. Morphine 15 mg every 6 hours not sufficiently controlling her pain, we converted to Dilaudid. This was Dilaudid 4 mg tabs 3-4 times a day. This is worked well and she is down to a twice a day use. Calling in #60 pills, and we will discontinue after 1 month. We are still awaiting Exos form 631 lumbosacral orthosis S/M for her back fracture. This was to facilitate healing following injury to the spine.  Fibromyalgia We will never be able to fully control her pain until her fibromyalgia is under control. We did a discontinuation of Zoloft and added Cymbalta at the last visit. Unfortunately she has not taken this, I did inform her that at 1 month we would be discontinuing her narcotics and so she probably needs to get the Cymbalta on board. Declined gabapentin and Lyrica.  Osteoporosis Age-related osteoporosis with new vertebral compression fracture. I did ask her to discuss this with her PCP. We discussed Fosamax as an initial option with calcium and vitamin D supplementation twice a day.  ___________________________________________ Ihor Austin. Benjamin Stain, M.D., ABFM., CAQSM. Primary Care and Sports Medicine  [facility]  Adjunct Professor of Family Medicine  [employer]

## 2018-08-08 NOTE — Assessment & Plan Note (Signed)
History of an L1 vertebral compression fracture post vertebroplasty. She now has a new T9 compression fracture with only mild height loss, I do not think she will need a vertebroplasty. Discontinue Miacalcin. Morphine 15 mg every 6 hours not sufficiently controlling her pain, we converted to Dilaudid. This was Dilaudid 4 mg tabs 3-4 times a day. This is worked well and she is down to a twice a day use. Calling in #60 pills, and we will discontinue after 1 month. We are still awaiting Exos form 631 lumbosacral orthosis S/M for her back fracture. This was to facilitate healing following injury to the spine.

## 2018-09-05 ENCOUNTER — Ambulatory Visit (INDEPENDENT_AMBULATORY_CARE_PROVIDER_SITE_OTHER): Payer: Medicare Other | Admitting: Sports Medicine

## 2018-09-05 ENCOUNTER — Encounter: Payer: Self-pay | Admitting: Sports Medicine

## 2018-09-05 DIAGNOSIS — M797 Fibromyalgia: Secondary | ICD-10-CM

## 2018-09-05 DIAGNOSIS — S22070D Wedge compression fracture of T9-T10 vertebra, subsequent encounter for fracture with routine healing: Secondary | ICD-10-CM

## 2018-09-05 DIAGNOSIS — S22070A Wedge compression fracture of T9-T10 vertebra, initial encounter for closed fracture: Secondary | ICD-10-CM

## 2018-09-05 MED ORDER — HYDROMORPHONE HCL 4 MG PO TABS: 4.0000 mg | ORAL_TABLET | Freq: Two times a day (BID) | ORAL | 0 refills | Status: DC | PRN

## 2018-09-05 MED ORDER — DULOXETINE HCL 60 MG PO CPEP: 60.0000 mg | ORAL_CAPSULE | Freq: Every day | ORAL | 3 refills | Status: AC

## 2018-09-05 NOTE — Assessment & Plan Note (Signed)
We are gaining better control of her pain I think partially due to the switch from Zoloft to Cymbalta. Increasing to 60 mg. She declined gabapentin and Lyrica. Return in a month.

## 2018-09-05 NOTE — Progress Notes (Signed)
Subjective:    CC: Follow-up  HPI: Yaricza returns, she is a pleasant 78 year old female, she has a chronic L1 compression fracture, subacute T9 vertebral compression fracture with minimal height loss as well as a right seventh rib fracture.  Overall doing much better on Dilaudid 4 mg twice daily.  I added Cymbalta at the last visit, and her depressive symptoms have improved considerably, she is more active, she is cooking she is moving around.  Happy with how things are going.  I reviewed the past medical history, family history, social history, surgical history, and allergies today and no changes were needed.  Please see the problem list section below in epic for further details.  Past Medical History: Past Medical History:  Diagnosis Date  . A-fib (HCC)   . Asthma   . Chronic sinusitis   . Dysphagia   . Fibromyalgia   . GERD (gastroesophageal reflux disease)    Past Surgical History: Past Surgical History:  Procedure Laterality Date  . BACK SURGERY     Social History: Social History   Socioeconomic History  . Marital status: Divorced    Spouse name: Not on file  . Number of children: Not on file  . Years of education: Not on file  . Highest education level: Not on file  Occupational History  . Not on file  Social Needs  . Financial resource strain: Not on file  . Food insecurity:    Worry: Not on file    Inability: Not on file  . Transportation needs:    Medical: Not on file    Non-medical: Not on file  Tobacco Use  . Smoking status: Never Smoker  . Smokeless tobacco: Never Used  Substance and Sexual Activity  . Alcohol use: Not Currently  . Drug use: Never  . Sexual activity: Not on file  Lifestyle  . Physical activity:    Days per week: Not on file    Minutes per session: Not on file  . Stress: Not on file  Relationships  . Social connections:    Talks on phone: Not on file    Gets together: Not on file    Attends religious service: Not on file   Active member of club or organization: Not on file    Attends meetings of clubs or organizations: Not on file    Relationship status: Not on file  Other Topics Concern  . Not on file  Social History Narrative  . Not on file   Family History: No family history on file. Allergies: Allergies  Allergen Reactions  . Aspirin     REACTION: swelling  . Codeine Phosphate     REACTION: nausea, itching  . Lyrica [Pregabalin]   . Oxycodone   . Pollen Extract    Medications: See med rec.  Review of Systems: No fevers, chills, night sweats, weight loss, chest pain, or shortness of breath.   Objective:    General: Well Developed, well nourished, and in no acute distress.  Neuro: Alert and oriented x3, extra-ocular muscles intact, sensation grossly intact.  HEENT: Normocephalic, atraumatic, pupils equal round reactive to light, neck supple, no masses, no lymphadenopathy, thyroid nonpalpable.  Skin: Warm and dry, no rashes. Cardiac: Regular rate and rhythm, no murmurs rubs or gallops, no lower extremity edema.  Respiratory: Clear to auscultation bilaterally. Not using accessory muscles, speaking in full sentences.  Impression and Recommendations:    Vertebral compression fracture (HCC) Old L1 compression fracture. Acute T9 compression fracture with mild height  loss. All of these are improving considerably. We did place her on Dilaudid 4 mg tabs, she is doing this twice a day. We will do this for an additional month before down taper. I also added Cymbalta, she does report better appetite, more active, happier. Increasing to 60 mg. Refilling Dilaudid. She ambulates with a rolling walker, and constipation is well controlled with Senokot-S and MiraLAX. She was unable to tolerate the TLSO brace. Return to see me in a month.   Fibromyalgia We are gaining better control of her pain I think partially due to the switch from Zoloft to Cymbalta. Increasing to 60 mg. She declined gabapentin  and Lyrica. Return in a month.   ___________________________________________ Ihor Austin. Benjamin Stain, M.D., ABFM., CAQSM. Primary Care and Sports Medicine  [facility]  Adjunct Professor of Family Medicine  [employer] [facility]

## 2018-09-05 NOTE — Assessment & Plan Note (Addendum)
Old L1 compression fracture. Acute T9 compression fracture with mild height loss. All of these are improving considerably. We did place her on Dilaudid 4 mg tabs, she is doing this twice a day. We will do this for an additional month before down taper. I also added Cymbalta, she does report better appetite, more active, happier. Increasing to 60 mg. Refilling Dilaudid. She ambulates with a rolling walker, and constipation is well controlled with Senokot-S and MiraLAX. She was unable to tolerate the TLSO brace. Return to see me in a month.

## 2018-10-03 ENCOUNTER — Ambulatory Visit: Payer: Medicare Other | Admitting: Sports Medicine

## 2018-10-04 ENCOUNTER — Ambulatory Visit (INDEPENDENT_AMBULATORY_CARE_PROVIDER_SITE_OTHER): Payer: Medicare Other | Admitting: Sports Medicine

## 2018-10-04 DIAGNOSIS — M797 Fibromyalgia: Secondary | ICD-10-CM

## 2018-10-04 DIAGNOSIS — S22070D Wedge compression fracture of T9-T10 vertebra, subsequent encounter for fracture with routine healing: Secondary | ICD-10-CM | POA: Diagnosis not present

## 2018-10-04 NOTE — Assessment & Plan Note (Signed)
Pain is completely controlled with the switch from Zoloft to Cymbalta. Declined gabapentin and Lyrica. Return as needed, no changes in Cymbalta dosing.

## 2018-10-04 NOTE — Assessment & Plan Note (Signed)
Old L1 compression fracture, acute T9 compression fracture with mild height loss. All of these have continued to improve and she is essentially pain-free. She is getting a bit of sundowning, and maybe some hallucinations and tiredness through the day with Dilaudid, will discontinue this and she can use Tylenol once to twice daily. Continue Cymbalta. I think she should continue to use her rolling walker, for safety. Her stool habits have improved as well. She can return to see me as needed for this.

## 2018-10-04 NOTE — Progress Notes (Signed)
Virtual Visit via Telephone   I connected with  Tanya Rodriguez  on 10/04/18 by telephone and verified that I am speaking with the correct person using two identifiers.   I discussed the limitations, risks, security and privacy concerns of performing an evaluation and management service by telephone, including the higher likelihood of inaccurate diagnosis and treatment, and the availability of in person appointments.  We also discussed the likely need of an additional face to face encounter for complete and high quality delivery of care.  I also discussed with the patient that there may be a patient responsible charge related to this service. The patient expressed understanding and wishes to proceed.  Subjective:    CC: Follow-up  HPI: This is a pleasant 78 year old female, we treated her for a vertebral compression fracture, she is doing extremely well, happy, and is eager to get off of her pain medications.  I reviewed the past medical history, family history, social history, surgical history, and allergies today and no changes were needed.  Please see the problem list section below in epic for further details.  Past Medical History: Past Medical History:  Diagnosis Date  . A-fib (HCC)   . Asthma   . Chronic sinusitis   . Dysphagia   . Fibromyalgia   . GERD (gastroesophageal reflux disease)    Past Surgical History: Past Surgical History:  Procedure Laterality Date  . BACK SURGERY     Social History: Social History   Socioeconomic History  . Marital status: Divorced    Spouse name: Not on file  . Number of children: Not on file  . Years of education: Not on file  . Highest education level: Not on file  Occupational History  . Not on file  Social Needs  . Financial resource strain: Not on file  . Food insecurity:    Worry: Not on file    Inability: Not on file  . Transportation needs:    Medical: Not on file    Non-medical: Not on file  Tobacco Use  . Smoking  status: Never Smoker  . Smokeless tobacco: Never Used  Substance and Sexual Activity  . Alcohol use: Not Currently  . Drug use: Never  . Sexual activity: Not on file  Lifestyle  . Physical activity:    Days per week: Not on file    Minutes per session: Not on file  . Stress: Not on file  Relationships  . Social connections:    Talks on phone: Not on file    Gets together: Not on file    Attends religious service: Not on file    Active member of club or organization: Not on file    Attends meetings of clubs or organizations: Not on file    Relationship status: Not on file  Other Topics Concern  . Not on file  Social History Narrative  . Not on file   Family History: No family history on file. Allergies: Allergies  Allergen Reactions  . Aspirin     REACTION: swelling  . Codeine Phosphate     REACTION: nausea, itching  . Lyrica [Pregabalin]   . Oxycodone   . Pollen Extract    Medications: See med rec.  Review of Systems: No fevers, chills, night sweats, weight loss, chest pain, or shortness of breath.   Objective:    General: Speaking full sentences, no audible heavy breathing.  Sounds alert and appropriately interactive.  No other physical exam performed due to the non-face  to face nature of this visit.  Impression and Recommendations:    Vertebral compression fracture (HCC) Old L1 compression fracture, acute T9 compression fracture with mild height loss. All of these have continued to improve and she is essentially pain-free. She is getting a bit of sundowning, and maybe some hallucinations and tiredness through the day with Dilaudid, will discontinue this and she can use Tylenol once to twice daily. Continue Cymbalta. I think she should continue to use her rolling walker, for safety. Her stool habits have improved as well. She can return to see me as needed for this.  Fibromyalgia Pain is completely controlled with the switch from Zoloft to Cymbalta. Declined  gabapentin and Lyrica. Return as needed, no changes in Cymbalta dosing.  I discussed the above assessment and treatment plan with the patient. The patient was provided an opportunity to ask questions and all were answered. The patient agreed with the plan and demonstrated an understanding of the instructions.   The patient was advised to call back or seek an in-person evaluation if the symptoms worsen or if the condition fails to improve as anticipated.   I provided 21 minutes of non-face-to-face time during this encounter, less than 50% of this was time needed to gather information, review chart, records, and complete documentation.   ___________________________________________ Ihor Austin. Benjamin Stain, M.D., ABFM., CAQSM. Primary Care and Sports Medicine [place] [facility]  Adjunct Professor of Family Medicine  [employer]

## 2019-08-28 ENCOUNTER — Ambulatory Visit: Payer: Medicare Other

## 2019-09-04 ENCOUNTER — Ambulatory Visit: Payer: Medicare Other | Attending: Internal Medicine

## 2019-09-04 DIAGNOSIS — Z23 Encounter for immunization: Secondary | ICD-10-CM

## 2019-09-04 NOTE — Progress Notes (Signed)
   Covid-19 Vaccination Clinic  Name:  Tanya Rodriguez    MRN: [id number] DOB: January 02, 1941  09/04/2019  [name] was observed post Covid-19 immunization for 30 minutes based on pre-vaccination screening without incident. She was provided with Vaccine Information Sheet and instruction to access the V-Safe system.   [name] was instructed to call 911 with any severe reactions post vaccine: Marland Kitchen Difficulty breathing  . Swelling of face and throat  . A fast heartbeat  . A bad rash all over body  . Dizziness and weakness   Immunizations Administered    Name Date Dose VIS Date Route   Pfizer COVID-19 Vaccine 09/04/2019  8:53 AM 0.3 mL 06/09/2019 Intramuscular   Manufacturer: [employer], [employer]   Lot: [id number]   NDC: [address]-6

## 2019-09-04 NOTE — Progress Notes (Deleted)
   Covid-19 Vaccination Clinic  Name:  Tanya Rodriguez    MRN: [id number] DOB: 18-Jun-1941  09/04/2019  Tanya Rodriguez was observed post Covid-19 immunization for 15 minutes without incident. She was provided with Vaccine Information Sheet and instruction to access the V-Safe system.   Tanya Rodriguez was instructed to call 911 with any severe reactions post vaccine: Marland Kitchen Difficulty breathing  . Swelling of face and throat  . A fast heartbeat  . A bad rash all over body  . Dizziness and weakness   Immunizations Administered    Name Date Dose VIS Date Route   Pfizer COVID-19 Vaccine 09/04/2019  8:53 AM 0.3 mL 06/09/2019 Intramuscular   Manufacturer: [employer], [employer]   Lot: [id number]   NDC: [address]-8

## 2019-10-04 ENCOUNTER — Ambulatory Visit: Payer: Medicare Other | Attending: Internal Medicine

## 2019-10-04 DIAGNOSIS — Z23 Encounter for immunization: Secondary | ICD-10-CM

## 2019-10-04 NOTE — Progress Notes (Signed)
   Covid-19 Vaccination Clinic  Name:  Jakiya Bookbinder    MRN: [id number] DOB: 12-22-1940  10/04/2019  Ms. Knarr was observed post Covid-19 immunization for 15 minutes without incident. She was provided with Vaccine Information Sheet and instruction to access the V-Safe system.   Ms. Mcelrath was instructed to call 911 with any severe reactions post vaccine: Marland Kitchen Difficulty breathing  . Swelling of face and throat  . A fast heartbeat  . A bad rash all over body  . Dizziness and weakness   Immunizations Administered    Name Date Dose VIS Date Route   Pfizer COVID-19 Vaccine 10/04/2019 10:03 AM 0.3 mL 06/09/2019 Intramuscular   Manufacturer: [employer], [employer]   Lot: [id number]   NDC: [address]-7

## 2020-06-05 IMAGING — DX DG RIBS W/ CHEST 3+V*R*
3 series · 3 of 3 positions shown · non-contrast
Comparison: 07/21/2018.

CLINICAL DATA: Chest pain.  Prior injury.

EXAM:
RIGHT RIBS AND CHEST - 3+ VIEW

[chest pa]
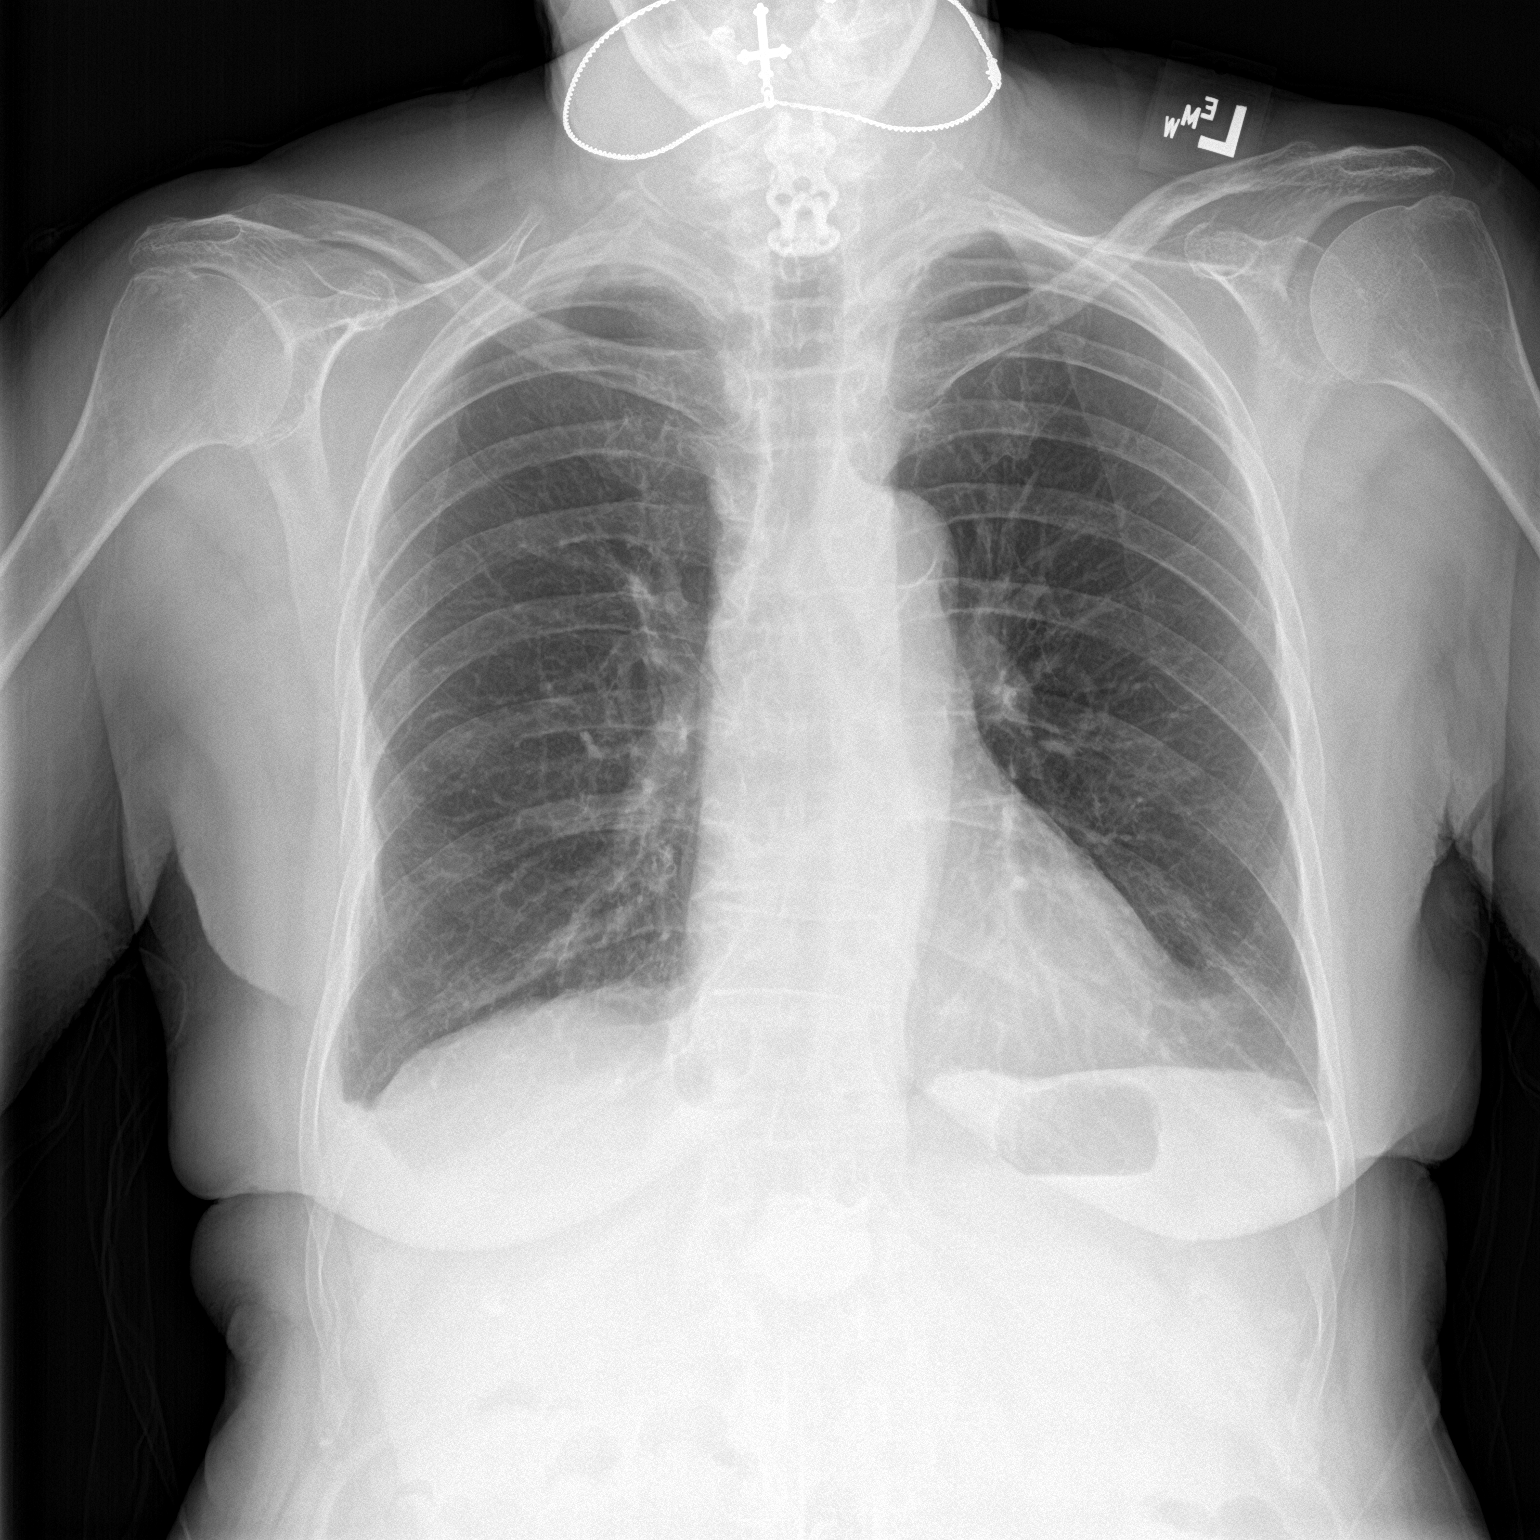

[rib pa (1 of 2)]
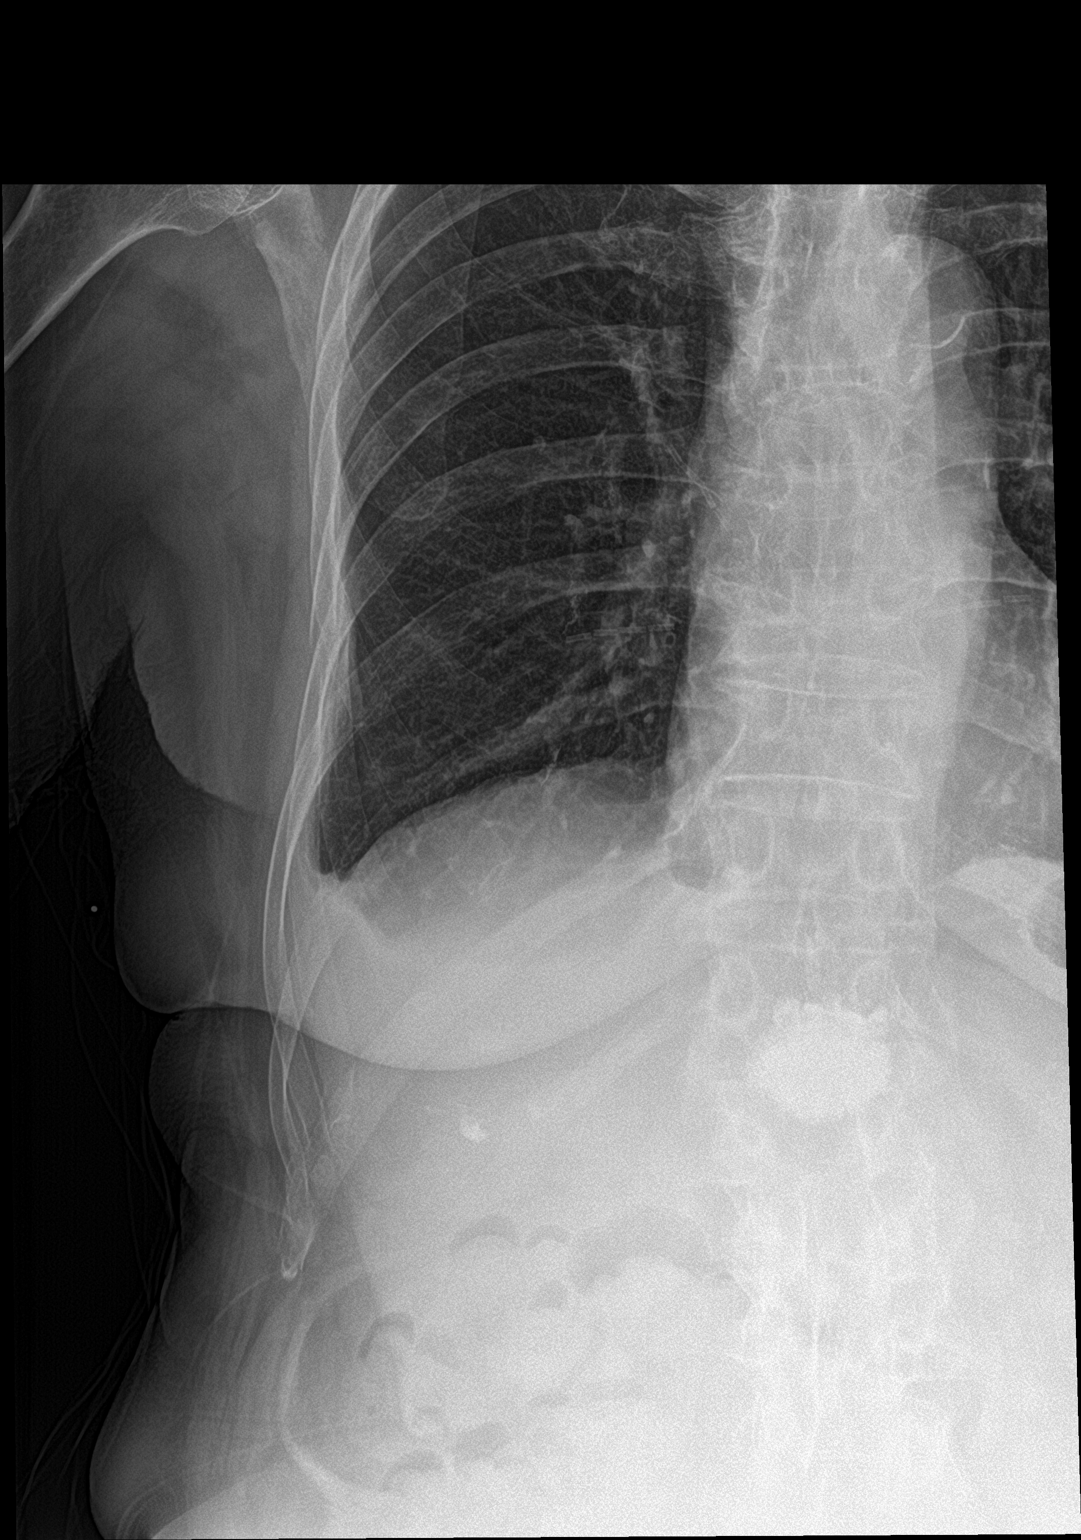

[rib pa (2 of 2)]
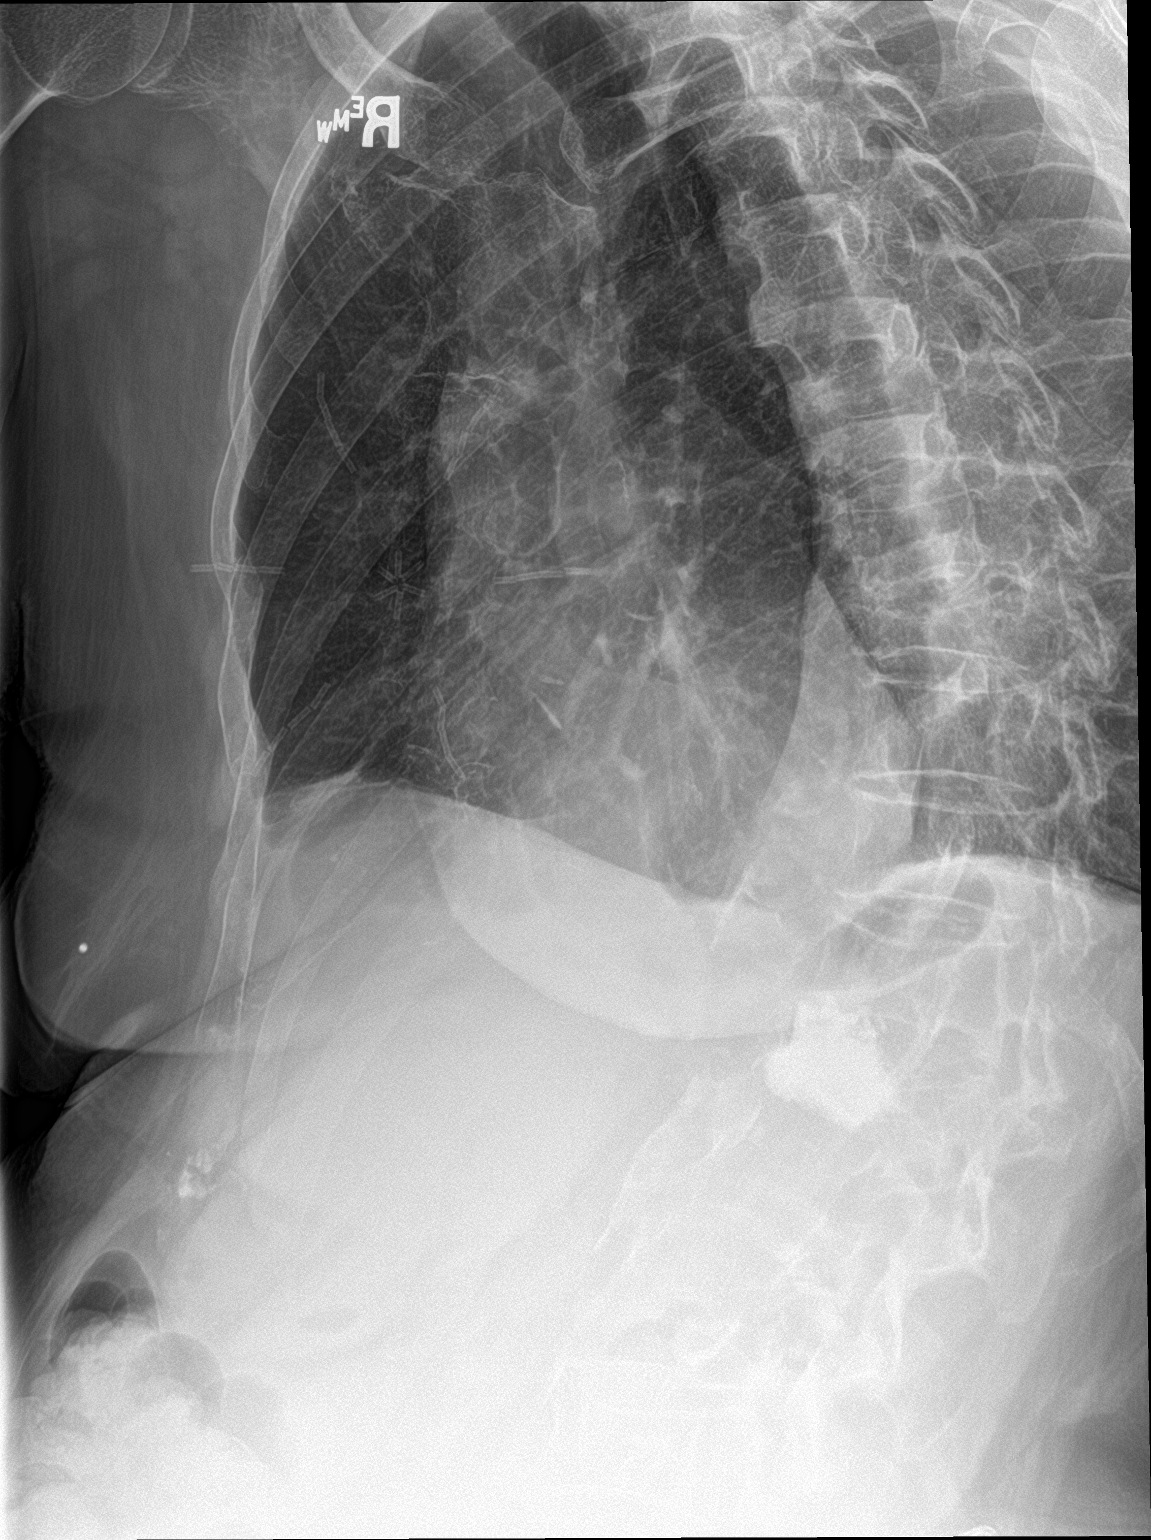

[3 of 3 positions shown; findings below may reference images not displayed]

FINDINGS: Mediastinum and hilar structures normal. Mild left base subsegmental
atelectasis. Small bilateral pleural effusions. No pneumothorax.
Slightly displaced right posterolateral seventh rib fracture is
noted. Midthoracic vertebral body compression fracture appears
stable. Degenerative change thoracic spine. Prior cervical spine
fusion appears stable. Prior upper lumbar spine vertebroplasty
appears stable.
IMPRESSION: 1. Slightly displaced right posterolateral sixth rib fracture. No
pneumothorax.

2.  Small bilateral pleural effusions.

## 2020-06-10 IMAGING — DX DG RIBS W/ CHEST 3+V*R*
3 series · 3 of 3 positions shown · non-contrast
Comparison: Radiographs July 21, 2018.

CLINICAL DATA: Right rib pain for 3 weeks.

EXAM:
RIGHT RIBS AND CHEST - 3+ VIEW

[chest pa]
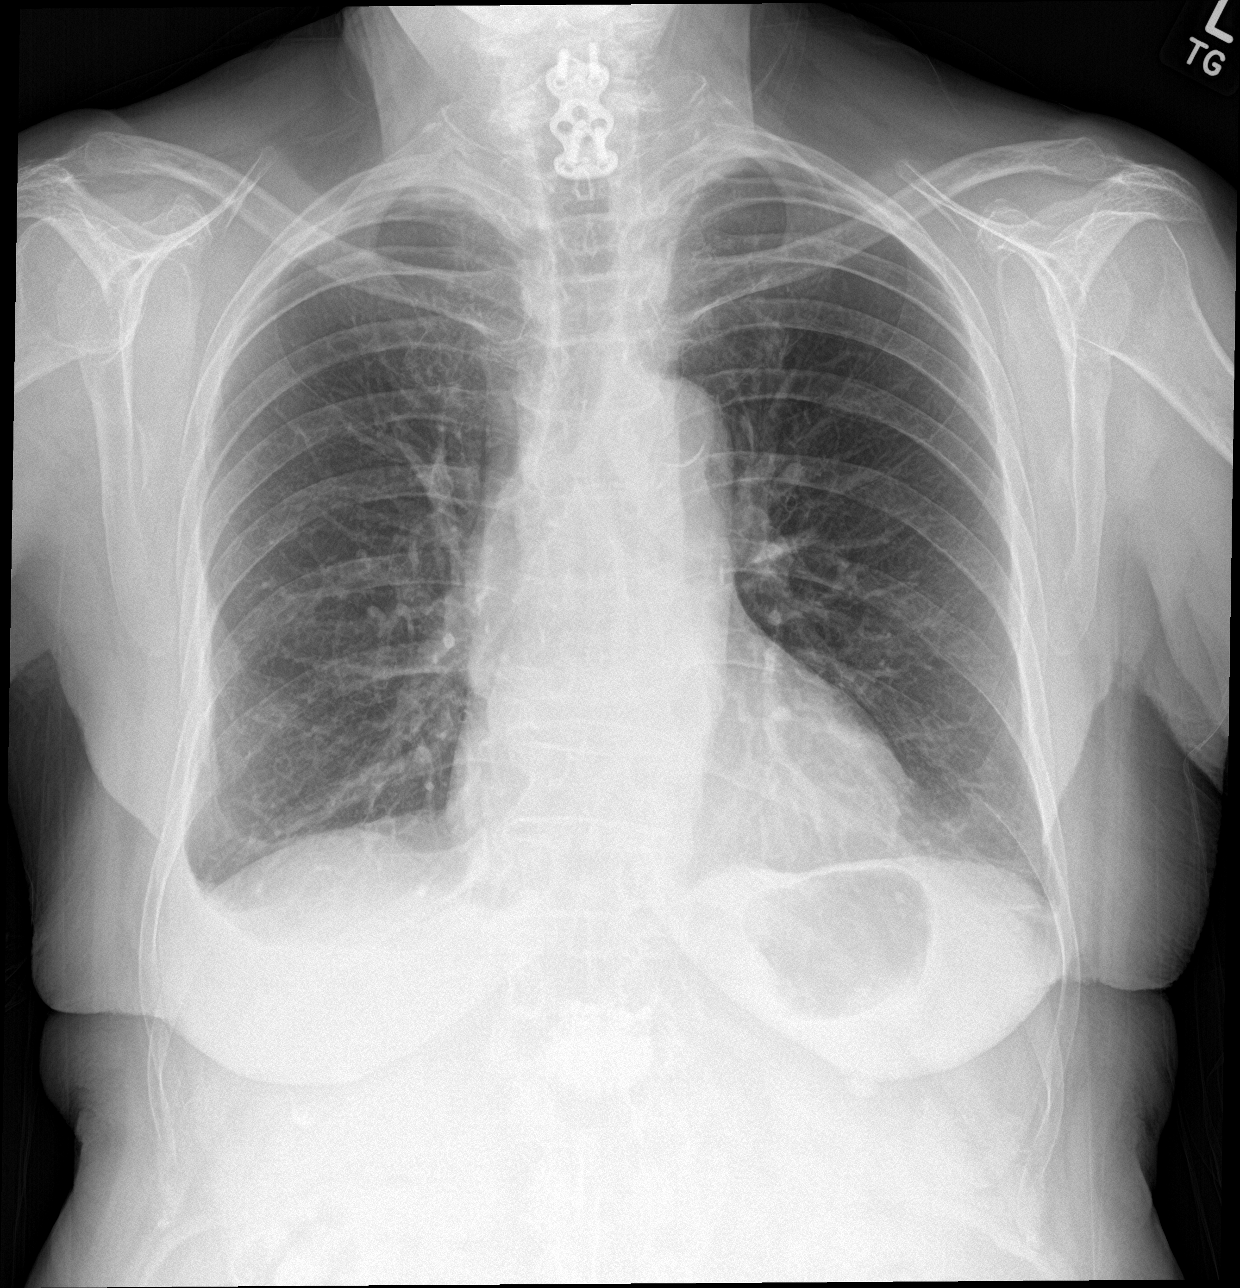

[rib ap]
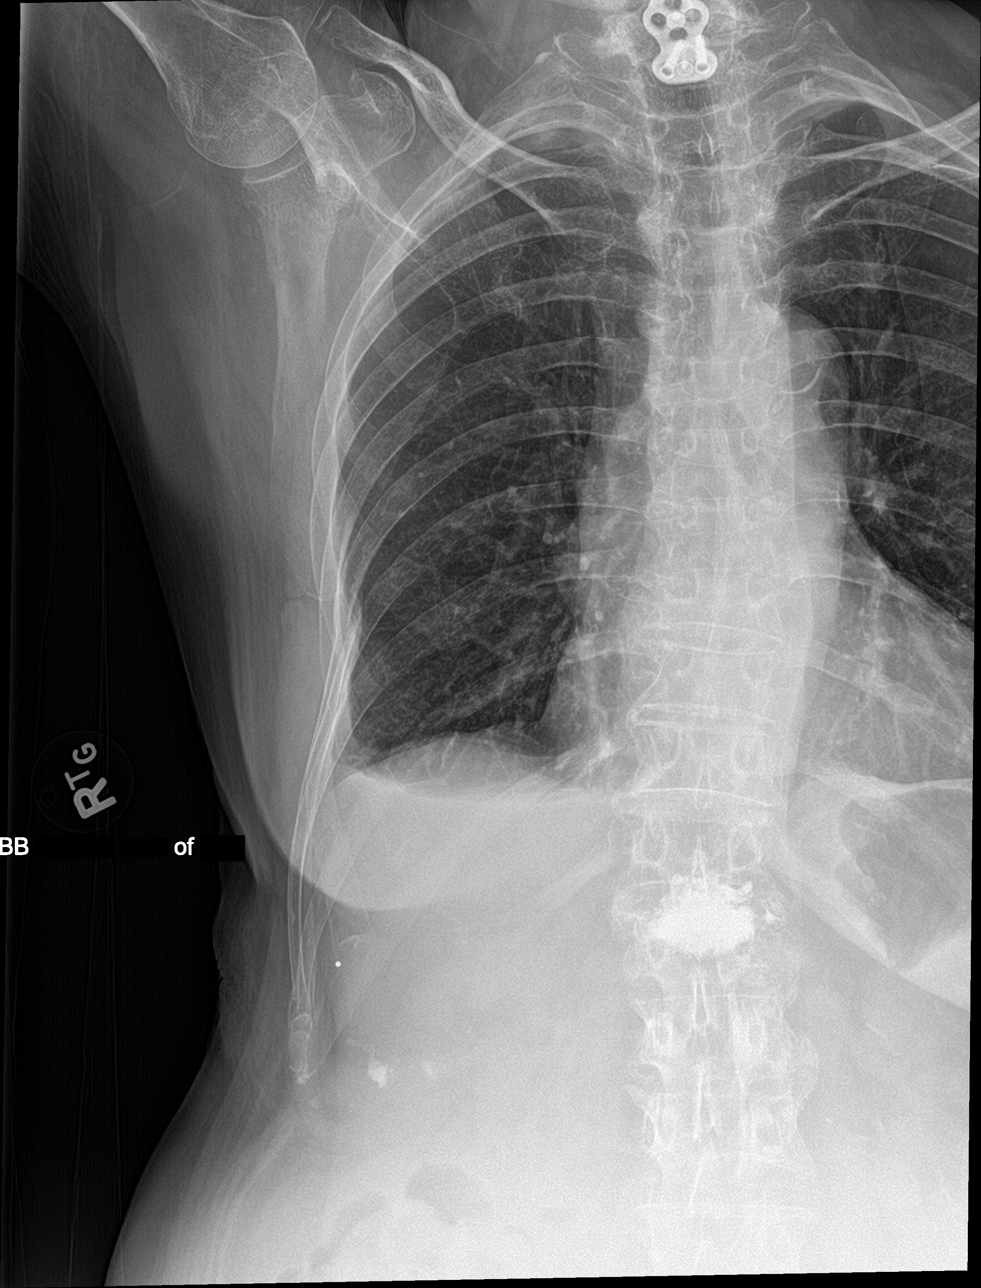

[rib ap obl]
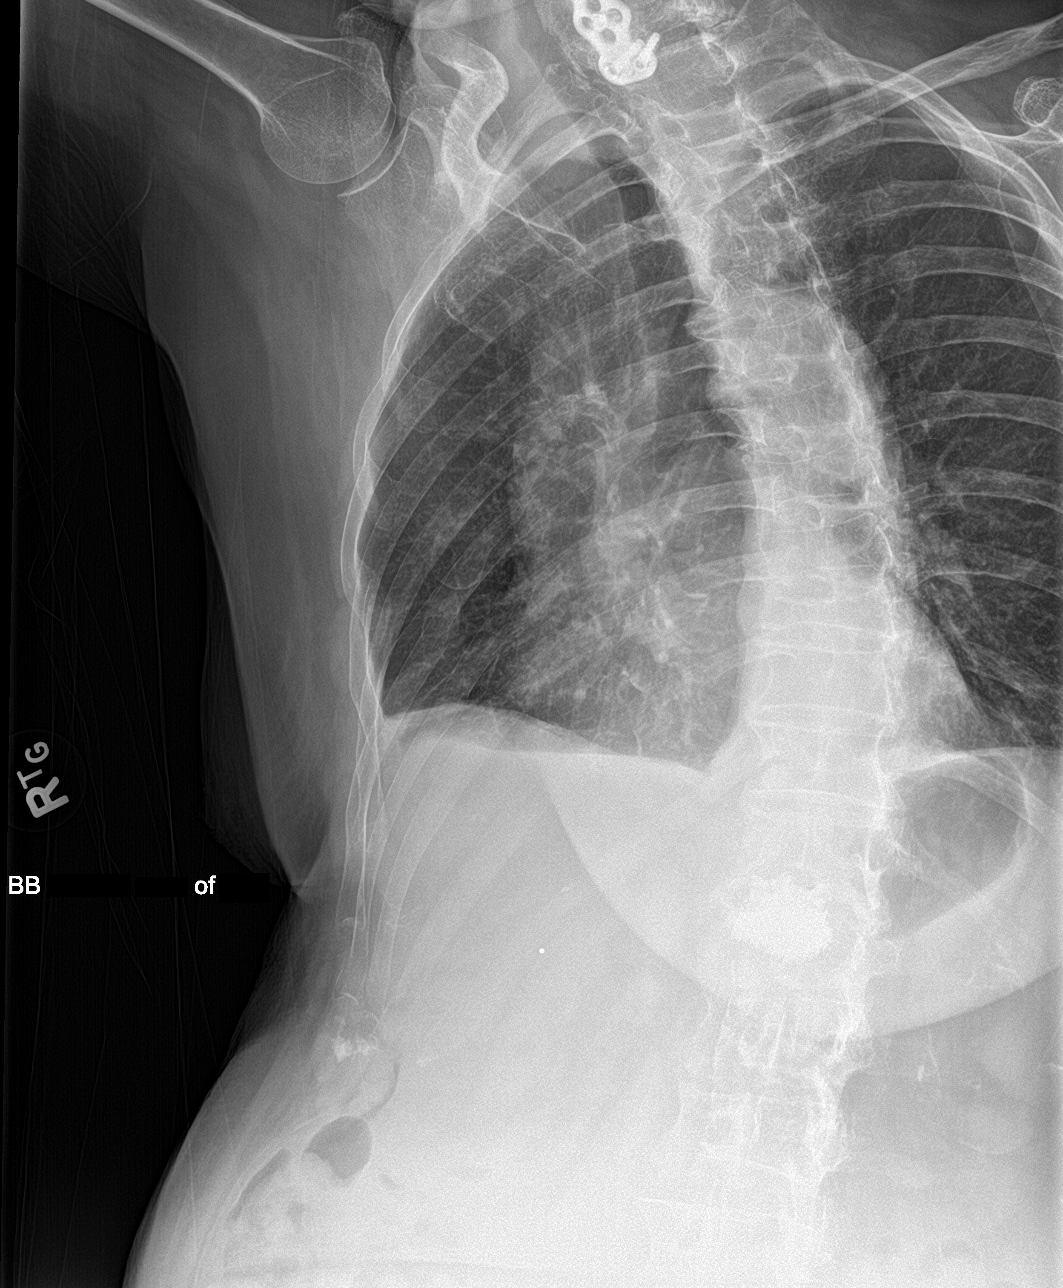

[3 of 3 positions shown; findings below may reference images not displayed]

FINDINGS: Stable cardiomediastinal silhouette. Atherosclerosis of thoracic
aorta is noted. No pneumothorax is noted. Left lung is clear. Stable
mild right pleural effusion is noted. Mildly displaced fracture is
seen involving the posterolateral portion of the right seventh rib
as noted on prior exam.
IMPRESSION: Mildly displaced right seventh rib fracture. Mild right pleural
effusion is noted and unchanged.

Aortic Atherosclerosis ([id number]).
